# Patient Record
Sex: Male | Born: 1935 | Race: Black or African American | Hispanic: No | Marital: Married | Smoking: Former smoker
Health system: Southern US, Community
[De-identification: ages and names within clinical notes are randomized; demographics above are authoritative.]

## PROBLEM LIST (undated history)

## (undated) DIAGNOSIS — C61 Malignant neoplasm of prostate: Secondary | ICD-10-CM

## (undated) DIAGNOSIS — E785 Hyperlipidemia, unspecified: Secondary | ICD-10-CM

## (undated) DIAGNOSIS — Z86718 Personal history of other venous thrombosis and embolism: Secondary | ICD-10-CM

## (undated) DIAGNOSIS — K5792 Diverticulitis of intestine, part unspecified, without perforation or abscess without bleeding: Secondary | ICD-10-CM

## (undated) DIAGNOSIS — I10 Essential (primary) hypertension: Secondary | ICD-10-CM

## (undated) DIAGNOSIS — E119 Type 2 diabetes mellitus without complications: Secondary | ICD-10-CM

## (undated) DIAGNOSIS — M1990 Unspecified osteoarthritis, unspecified site: Secondary | ICD-10-CM

## (undated) HISTORY — DX: Diverticulitis of intestine, part unspecified, without perforation or abscess without bleeding: K57.92

## (undated) HISTORY — DX: Malignant neoplasm of prostate: C61

## (undated) HISTORY — DX: Personal history of other venous thrombosis and embolism: Z86.718

## (undated) HISTORY — PX: APPENDECTOMY: SHX54

## (undated) HISTORY — DX: Unspecified osteoarthritis, unspecified site: M19.90

## (undated) HISTORY — DX: Hyperlipidemia, unspecified: E78.5

## (undated) HISTORY — DX: Type 2 diabetes mellitus without complications: E11.9

## (undated) HISTORY — DX: Essential (primary) hypertension: I10

---

## 1998-08-17 ENCOUNTER — Ambulatory Visit (HOSPITAL_COMMUNITY): Admission: RE | Admit: 1998-08-17 | Discharge: 1998-08-17 | Payer: Self-pay | Admitting: Internal Medicine

## 1999-10-01 ENCOUNTER — Other Ambulatory Visit: Admission: RE | Admit: 1999-10-01 | Discharge: 1999-10-01 | Payer: Self-pay | Admitting: Urology

## 1999-12-08 ENCOUNTER — Encounter: Payer: Self-pay | Admitting: Internal Medicine

## 1999-12-08 ENCOUNTER — Encounter: Admission: RE | Admit: 1999-12-08 | Discharge: 1999-12-08 | Payer: Self-pay | Admitting: Internal Medicine

## 2000-04-24 ENCOUNTER — Encounter (INDEPENDENT_AMBULATORY_CARE_PROVIDER_SITE_OTHER): Payer: Self-pay | Admitting: Specialist

## 2000-04-24 ENCOUNTER — Other Ambulatory Visit: Admission: RE | Admit: 2000-04-24 | Discharge: 2000-04-24 | Payer: Self-pay | Admitting: Urology

## 2000-05-01 ENCOUNTER — Encounter: Payer: Self-pay | Admitting: Urology

## 2000-05-01 ENCOUNTER — Encounter: Admission: RE | Admit: 2000-05-01 | Discharge: 2000-05-01 | Payer: Self-pay | Admitting: Urology

## 2000-05-05 ENCOUNTER — Encounter: Admission: RE | Admit: 2000-05-05 | Discharge: 2000-08-03 | Payer: Self-pay | Admitting: Radiation Oncology

## 2000-11-14 ENCOUNTER — Encounter: Payer: Self-pay | Admitting: Emergency Medicine

## 2000-11-14 ENCOUNTER — Inpatient Hospital Stay (HOSPITAL_COMMUNITY): Admission: EM | Admit: 2000-11-14 | Discharge: 2000-11-23 | Payer: Self-pay | Admitting: Emergency Medicine

## 2000-11-16 ENCOUNTER — Encounter: Payer: Self-pay | Admitting: Internal Medicine

## 2000-11-20 ENCOUNTER — Encounter: Payer: Self-pay | Admitting: Internal Medicine

## 2000-12-01 ENCOUNTER — Encounter: Admission: RE | Admit: 2000-12-01 | Discharge: 2000-12-01 | Payer: Self-pay | Admitting: Internal Medicine

## 2000-12-01 ENCOUNTER — Encounter: Payer: Self-pay | Admitting: Internal Medicine

## 2004-07-05 ENCOUNTER — Ambulatory Visit (HOSPITAL_COMMUNITY): Admission: RE | Admit: 2004-07-05 | Discharge: 2004-07-05 | Payer: Self-pay | Admitting: Internal Medicine

## 2006-05-30 ENCOUNTER — Encounter: Admission: RE | Admit: 2006-05-30 | Discharge: 2006-05-30 | Payer: Self-pay | Admitting: Internal Medicine

## 2007-10-16 ENCOUNTER — Encounter: Admission: RE | Admit: 2007-10-16 | Discharge: 2007-10-16 | Payer: Self-pay | Admitting: Internal Medicine

## 2009-03-04 ENCOUNTER — Ambulatory Visit (HOSPITAL_COMMUNITY): Admission: RE | Admit: 2009-03-04 | Discharge: 2009-03-04 | Payer: Self-pay | Admitting: Cardiology

## 2009-03-10 ENCOUNTER — Inpatient Hospital Stay (HOSPITAL_BASED_OUTPATIENT_CLINIC_OR_DEPARTMENT_OTHER): Admission: RE | Admit: 2009-03-10 | Discharge: 2009-03-10 | Payer: Self-pay | Admitting: Cardiology

## 2010-06-24 ENCOUNTER — Encounter
Admission: RE | Admit: 2010-06-24 | Discharge: 2010-06-24 | Payer: Self-pay | Source: Home / Self Care | Attending: Internal Medicine | Admitting: Internal Medicine

## 2010-10-23 LAB — POCT I-STAT GLUCOSE
Glucose, Bld: 103 mg/dL — ABNORMAL HIGH (ref 70–99)
Operator id: 194801

## 2010-11-04 ENCOUNTER — Encounter: Payer: Self-pay | Admitting: Internal Medicine

## 2010-11-30 NOTE — Cardiovascular Report (Signed)
NAME:  JAEGER, TRUEHEART               ACCOUNT NO.:  [id number]   MEDICAL RECORD NO.:  [id number]          PATIENT TYPE:  OIB   LOCATION:  1999                         FACILITY:  [facility]   PHYSICIAN:  Mohan N. Sharyn Lull, M.D. DATE OF BIRTH:  03/01/1926   DATE OF PROCEDURE:  03/10/2009  DATE OF DISCHARGE:  03/10/2009                            CARDIAC CATHETERIZATION   PROCEDURE:  Left cardiac cath with selective left and right coronary  angiography, left ventriculography via right groin using Judkins  technique.   INDICATIONS FOR PROCEDURE:  Mr. Bither is an 77 year old black male with  past medical history significant for hypertension, non-insulin-dependent  diabetes mellitus, sick sinus syndrome with marked asymptomatic sinus  bradycardia, degenerative joint disease, hypercholesteremia, history of  pulmonary embolism and DVT in the past, complains of numbness in both  shoulders and arms associated with diaphoresis lasting 10-15 minutes.  Denies any chest pain, nausea, denies shortness of breath.  Denies  palpitation, lightheadedness or syncope.  Denies dizziness.  Denies PND,  orthopnea, leg swelling.  The patient underwent Persantine Myoview on  03/04/09 which showed mild reversible ischemia in the mid and apical  segment of the inferior wall with EF of 63%.   PAST MEDICAL HISTORY:  As above.   PAST SURGICAL HISTORY:  He had appendectomy in the past.   ALLERGIES:  No known drug allergies.   MEDICATIONS:  At home he is on enteric-coated aspirin 81 mg p.o. daily,  Procardia XL 30 mg p.o. daily.  The patient on metformin 500 mg 1 tablet  daily.   SOCIAL HISTORY:  Married, retired, worked for [employer] in the  past.  Smokes less than half pack per day for 15 plus years, quit 50+  years ago.  No history of alcohol abuse.   FAMILY HISTORY:  Father died of questionable MI at the age of 30.  He  had severe disabling arthritis.  Mother died of complications of  diabetes.  She was  hypertensive at the age of 70.  One sister died of  CA.   PHYSICAL EXAMINATION:  GENERAL:  He is alert, awake, oriented x3 in no  acute distress.  VITAL SIGNS:  Blood pressure was 130/60, pulse was with inferior 40-45,  marked sinus bradycardia.  HEENT:  Conjunctivae pink.  NECK:  Supple.  No JVD, no bruit.  LUNGS:  Clear to auscultation without rhonchi or rales.  CARDIOVASCULAR:  S1, S2 was normal.  There was soft systolic murmur.  ABDOMEN:  Soft.  Bowel sounds present, nontender.  EXTREMITIES:  There is no clubbing, cyanosis or edema.   IMPRESSION:  Left arm numbness, abnormal Persantine Myoview, probably  angina equivalent, rule out coronary insufficiency, sick sinus syndrome  with marked sinus bradycardia, hypertension, non-insulin-dependent  diabetes mellitus, hypercholesteremia, history of pulmonary embolism and  DVT.  Discussed with the patient and his wife regarding Persantine  Myoview result and left cath, its risks and benefits i.e. death, MI, and  stroke, need for emergency CABG, local vascular complications etc. and  consented for the cath.   PROCEDURE:  After obtaining the informed consent, the patient was  brought to the cath lab and was placed on fluoroscopy table.  Right  groin was prepped and draped in usual fashion.  2% Xylocaine was used  for local anesthesia in the right groin with help of thin-wall needle 4-  French arterial sheath was placed.  The sheath was aspirated and  flushed.  Next, 4-French left Judkins catheter was advanced over the  wire under fluoroscopic guidance up to the ascending aorta.  Wire was  pulled out, the catheter was aspirated and connected to the manifold.  Catheter was further advanced and engaged into the left coronary ostium.  Multiple views of the left system were taken.  Next catheter was  disengaged and was pulled out over the wire and was replaced with 4-  [place] 3-D right Judkins, catheter was advanced over the wire under   fluoroscopic guidance up to the ascending aorta.  Wire was pulled out  and the catheter was aspirated and connected to the manifold.  Catheter  was further advanced and engaged into right coronary ostium.  Multiple  views of the right system were taken.  Next catheter was disengaged and  was pulled out over the wire and was replaced with 4-French pigtail  catheter which was advanced over the wire under fluoroscopic guidance up  to the ascending aorta.  Wire was pulled out, the catheter was aspirated  and connected to the manifold.  Catheter was further advanced across the  aortic valve into the LV.  LV pressures were recorded.  Next LV graft  was done in 30 degrees RAO position.  Post angiographic pressures were  recorded from LV and then pullback pressures were recorded from the  aorta.  There was no gradient across the aortic valve.  Next a pigtail  catheter was pulled out over the wire.  Sheaths were aspirated and  flushed.  LV showed good LV systolic function, EF of 55-60%.  There was  mild LVH, left main was patent.  LAD has 20% ostial stenosis and 40-50%  mid stenosis with haziness with TIMI grade III distal flow.  Diagonal  one has 50-60% ostial stenosis.  Diagonal II has 70-75% proximal  sequential stenosis.  Vessel is small, less than 2 mm in size.  Left  circumflex has 15-20% ostial stenosis and 40-50% mid and 60-70% distal  focal stenosis.  OM1 is very very small.  OM-2 has 50-60% ostial  stenosis.  OM III and four were very small.  RCA has 15-20% distal  stenosis.  PDA is small which is patent.  PLV branches were very very  small.  The patient tolerated procedure well.  There were no  complications.  The patient was transferred to recovery room in stable  condition.      Eduardo Osier. Sharyn Lull, M.D.  Electronically Signed     [facility]/MEDQ  D:  03/10/2009  T:  03/11/2009  Job:  [id number]

## 2010-12-03 NOTE — H&P (Signed)
[place]  Patient:    Kyle Schwartz, Kyle Schwartz                      MRN: [id number] Adm. Date:  [id number] Attending:  Gwenyth Bender                         History and Physical  CHIEF COMPLAINT:  Pleuritic chest pain, pulmonary embolus.  HISTORY OF PRESENT ILLNESS:  First recent [facility] admission for this 77 year old married black male with past history of prostatic CA and hypertension, who had otherwise been doing well up until last night.  He states that he developed "a mild cold" yesterday evening.  This was associated with some sinus congestion and sneezing with cough.  He rested that night; however, during the night, approximately at 11 p.m., he noted the onset of pleuritic right-sided pain.  He rated the pain as a 10/10.  It was worse with turning as well.  He noted increased pain with cough.  There was no hematemesis or diaphoresis.  During the night, the pain persisted.  This morning, as there was no improvement, the family contacted the office and he was advised to come to the emergency room for further evaluation.  PAST MEDICAL HISTORY:  Past medical history is remarkable for prostatic CA. He is status post radiation treatment.  He has a long-standing history of hypertension, distant history of atrial fibrillation which has been stable, status post appendectomy.  ALLERGIES:  No known allergies.  MEDICATIONS: 1. Norvasc 5 mg p.o. q.d. 2. Vioxx 25 mg q.d. p.r.n. arthralgias.  HABITS:  Patient does not smoke.  Rare alcohol intake.  SOCIAL HISTORY:  Patient is retired from the [employer], therapeutic.  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  No recent trauma.  REVIEW OF SYSTEMS:  Remarkable for transient episode of bilateral lower extremity edema after a prolonged airline flight, this, however, resolved completely without leg pains.  No episodic edema.  No associated shortness of breath or chest pains during that  episode.  History also significant for hemorrhoids in the past.  He has also been documented to have small scattered diverticula on colonoscopy approximately two years ago.  PHYSICAL EXAMINATION:  GENERAL:  He is a well-developed, well-nourished black male in mild distress.  VITAL SIGNS:  Blood pressure of 134/70, pulse 64, respiratory rate 18.  Height 70 inches.  Weight 188 pounds.  HEENT:  Head normocephalic, atraumatic, without bruits.  Extraocular muscles are intact.  Fundi grade 1.  No sinus tenderness.  He has left turbinate edema greater than right.  TMs with decreased light reflex, without erythematous changes.  Throat:  Posterior pharynx is clear.  NECK:  Supple.  No enlarged thyroid.  No positive cervical nodes.  No carotid bruits.  LUNGS:  Notable for decreased breath sounds in the right base.  Left base is relatively clear.  No wheezes or rhonchi appreciated.  CARDIOVASCULAR:  He has a normal S1 and S2, no S3 or S4.  He has a 1/6 systolic ejection murmur in the apical area.  No rub appreciated.  ABDOMEN:  Bowel sounds are present.  He has mild right upper quadrant tenderness to deep palpation.  No epigastric tenderness.  No left flank tenderness.  There is minimal right lower quadrant tenderness to deep palpation.  No masses are appreciated.  GU:  Normal male with bilaterally descended testes.  MUSCULOSKELETAL:  Full  passive range of motion in the lower extremities with minimal crepitus in the knees.  Negative Homans.  Trace edema.  Pulses are intact.  NEUROLOGIC:  Intact.  LABORATORY DATA:  CBC revealed WBC of 7000, hemoglobin 11.4, hematocrit of 34.1, platelets 312,000.  Chemistries:  Sodium 137, potassium 4.0, chloride 109, CO2 33.7, BUN of 15, creatinine 1.1, glucose of 136.  CT scan of the chest shows pulmonary emboli in lower lobes bilaterally, right greater than left.  No evidence of emboli or thrombosis in the venous system.  EKG shows normal sinus  rhythm, normal axis without acute changes appreciated.  IMPRESSION: 1. New-onset pulmonary emboli bilaterally.  The patient is currently    symptomatic with pleuritic pain. 2. Status post radiation treatment for prostatic carcinoma. 3. Transient lower extremity edema, resolved, rule out occult deep venous    thrombosis. 4. History of hypertension (stable). 5. Allergic rhinitis with recent exacerbation. 6. History of degenerative joint disease, presently asymptomatic. 7. Distant history of diverticulosis without recent obvious exacerbation.  PLAN:  Patient to be admitted for further treatment of his pulmonary emboli. He will be placed on heparin protocol with Coumadin therapy as well, followed by pharmacy.  Will need to maintain a low therapeutic index in lieu of risk for bleeding from his GI tract.  Will follow up with venous Dopplers of the lower extremities as well.  Restart his Norvasc in low dose as necessary. Control pain acutely at this time.  Further therapy pending response to the above. DD:  11/14/00 TD:  11/15/00 Job: [id number]

## 2010-12-03 NOTE — Discharge Summary (Signed)
[facility]  Patient:    Kyle Schwartz, Kyle Schwartz                      MRN: [id number] Adm. Date:  [id number] Disc. Date: [id number] Attending:  Gwenyth Bender                           Discharge Summary  FINAL DIAGNOSES: 1. Pulmonary embolus and infarction (4 and 5.19). 2. Pulmonary collapse (69678). 3. Anemia (285.9). 4. Bronchitis (490). 5. Allergic rhinitis (477.9). 6. History of prostatic carcinoma (185).  PROCEDURE:  None.  HISTORY OF PRESENT ILLNESS:  This was the first recent [facility] admission for this 77 year old married black male with a past history of prostatic CA and hypertension who otherwise had been doing well until one night prior to admission.  The patient states that he developed a mild cold yesterday evening.  This was associated with some sinus congestion, sneezing, and cough.  During the night the patient noted the onset of right-sided pleuritic chest pain.  The pain was rated as a 10/10.  Patient noted increasing pain with cough as well.  There was no associated hematemesis or diaphoresis.  The pain persisted through most of the night.  The subsequent morning patient was advised to come to the emergency room for further evaluation and admission thereafter.  PAST MEDICAL HISTORY:  As noted above.  He is status post radiation treatment for prostatic CA.  Patient has a long-standing history of hypertension, distant history of atrial fibrillation which had most recently been stable, status post appendectomy.  PHYSICAL EXAMINATION:  Per admission H&P.  HOSPITAL COURSE:  Patient was admitted for new onset of pulmonary embolus. This was confirmed by a CT scan which showed pulmonary emboli in the lower lobes bilaterally, though right greater than left.  Notably, there was no evidence of thrombosis in the venous system.  Patient was admitted to telemetry.  He was started on IV heparin per protocol as well as Coumadin therapy.   The subsequent day venous Dopplers of his lower extremities were obtained which showed no evidence for deep venous disease as well.  Over the subsequent days patient did very well.  His heparin was maintained while his Coumadin dosing was adjusted to obtain a therapeutic range.  He was noted to have mild anemia during his hospital stay.  On further questioning he had noted he had problems with hemorrhoids several weeks ago which had subsequently subsided.  No occult blood was noted, however, during this hospital stay.  On followup examination he was noted to have rhonchi in both bases.  Chest x-ray showed linear atelectasis bilaterally.  There was some suggestion of bronchitis clinically and he was treated with antibiotics empirically as well.  Patient thereafter continued to improve.  He was gradually made more ambulatory which he tolerated well.  He underwent thorough education regarding the use of Coumadin and appropriate precautions.  After several days of adjustments with his Coumadin patient did become therapeutic. His heparin was subsequently discontinued.  He continued to progress thereafter without further sequela.  By Nov 23, 2000 he was doing better and felt to be stable for discharge home.  DISCHARGE MEDICATIONS: 1. Coumadin 5 mg p.o. q.d. 2. ______ 500 mg b.i.d. for seven days. 3. Norvasc 5 mg q.d. 4. Claritin 10 mg q.d. 5. Darvocet-N 100 one p.o. q.4h. p.r.n. pain.  Patient will need a followup INR in four  days time.  He has been advised to rest at home over the next weeks time with reevaluation thereafter.  He will be placed on a no added salt diet.  Patient will need a followup chest x-ray to document resolution of the bilateral opacities that were seen during this hospital stay. DD:  01/10/01 TD:  01/11/01 Job: [id number]

## 2011-08-22 ENCOUNTER — Other Ambulatory Visit: Payer: Self-pay | Admitting: Cardiology

## 2012-03-20 ENCOUNTER — Encounter: Payer: Self-pay | Admitting: Gastroenterology

## 2012-03-26 ENCOUNTER — Encounter: Payer: Self-pay | Admitting: Gastroenterology

## 2012-04-19 ENCOUNTER — Encounter: Payer: Self-pay | Admitting: Gastroenterology

## 2012-04-19 ENCOUNTER — Ambulatory Visit (INDEPENDENT_AMBULATORY_CARE_PROVIDER_SITE_OTHER): Payer: No Typology Code available for payment source | Admitting: Gastroenterology

## 2012-04-19 VITALS — BP 132/60 | HR 72 | Ht 67.0 in | Wt 187.8 lb

## 2012-04-19 DIAGNOSIS — K627 Radiation proctitis: Secondary | ICD-10-CM | POA: Insufficient documentation

## 2012-04-19 DIAGNOSIS — K6289 Other specified diseases of anus and rectum: Secondary | ICD-10-CM

## 2012-04-19 DIAGNOSIS — Z1211 Encounter for screening for malignant neoplasm of colon: Secondary | ICD-10-CM | POA: Insufficient documentation

## 2012-04-19 NOTE — Assessment & Plan Note (Signed)
Asymptomatic. 

## 2012-04-19 NOTE — Assessment & Plan Note (Signed)
In view of the patient's age routine screening colonoscopy is not generally recommended. I explained this to the patient who is comfortable with this decision.

## 2012-04-19 NOTE — Patient Instructions (Addendum)
Follow up as needed

## 2012-04-19 NOTE — Progress Notes (Signed)
History of Present Illness: Kyle Schwartz 78 year old Afro-American male here to inquire about screening colonoscopy. He has no GI complaints except for mild constipation which has been chronic. Last colonoscopy in 2003 demonstrated radiation changes to the rectum. He has a history of prostate cancer. He has no other GI complaints including change of bowel habits, abdominal pain, melena or hematochezia.    Past Medical History  Diagnosis Date  . Arthritis   . Prostate cancer   . Diverticulitis   . Hx of blood clots     2002  . Hypertension   . Hyperlipemia    Past Surgical History  Procedure Date  . Appendectomy     1963   family history includes Diabetes in an unspecified family member; Heart disease in his father; and Lung cancer in his sister. Current Outpatient Prescriptions  Medication Sig Dispense Refill  . aspirin 81 MG tablet Take 81 mg by mouth daily.      . benazepril (LOTENSIN) 20 MG tablet Take 20 mg by mouth daily.       . Cholecalciferol (VITAMIN D3) 2000 UNITS TABS Take by mouth 1 day or 1 dose.      Marland Kitchen CRESTOR 10 MG tablet Take 10 mg by mouth daily.       Marland Kitchen doxazosin (CARDURA) 4 MG tablet Take 4 mg by mouth at bedtime.       . fish oil-omega-3 fatty acids 1000 MG capsule Take 2 g by mouth daily.      . metFORMIN (GLUCOPHAGE) 500 MG tablet Take 500 mg by mouth daily with breakfast.       . Multiple Vitamins-Minerals (CENTRUM SILVER PO) Take by mouth 1 day or 1 dose.       Allergies as of 04/19/2012  . (Not on File)    reports that he has quit smoking. He has never used smokeless tobacco. He reports that he does not drink alcohol or use illicit drugs.     Review of Systems: Pertinent positive and negative review of systems were noted in the above HPI section. All other review of systems were otherwise negative.  Vital signs were reviewed in today's medical record Physical Exam: General: Well developed , well nourished, no acute distress Head: Normocephalic and  atraumatic Eyes:  sclerae anicteric, EOMI Ears: Normal auditory acuity Mouth: No deformity or lesions Neck: Supple, no masses or thyromegaly Lungs: Clear throughout to auscultation Heart: Regular rate and rhythm; no murmurs, rubs or bruits Abdomen: Soft, non tender and non distended. No masses, hepatosplenomegaly or hernias noted. Normal Bowel sounds Rectal:deferred Musculoskeletal: Symmetrical with no gross deformities  Skin: No lesions on visible extremities Pulses:  Normal pulses noted Extremities: No clubbing, cyanosis, edema or deformities noted Neurological: Alert oriented x 4, grossly nonfocal Cervical Nodes:  No significant cervical adenopathy Inguinal Nodes: No significant inguinal adenopathy Psychological:  Alert and cooperative. Normal mood and affect

## 2012-05-31 LAB — BASIC METABOLIC PANEL
Creatinine: 1.2 mg/dL (ref 0.6–1.3)
Potassium: 4.5 mmol/L (ref 3.4–5.3)
Sodium: 142 mmol/L (ref 137–147)

## 2012-05-31 LAB — HEPATIC FUNCTION PANEL
ALT: 13 U/L (ref 10–40)
Alkaline Phosphatase: 53 U/L (ref 25–125)

## 2012-05-31 LAB — CBC AND DIFFERENTIAL: HCT: 41 % (ref 41–53)

## 2012-09-06 ENCOUNTER — Encounter: Payer: Self-pay | Admitting: General Practice

## 2012-09-06 DIAGNOSIS — J309 Allergic rhinitis, unspecified: Secondary | ICD-10-CM

## 2012-09-06 DIAGNOSIS — E559 Vitamin D deficiency, unspecified: Secondary | ICD-10-CM | POA: Insufficient documentation

## 2014-09-03 ENCOUNTER — Ambulatory Visit
Admission: RE | Admit: 2014-09-03 | Discharge: 2014-09-03 | Disposition: A | Discharge status: Home / Self Care | Payer: Medicare Other | Source: Ambulatory Visit | Attending: Internal Medicine | Admitting: Internal Medicine

## 2014-09-03 ENCOUNTER — Other Ambulatory Visit: Payer: Self-pay | Admitting: Internal Medicine

## 2014-09-03 DIAGNOSIS — R0602 Shortness of breath: Secondary | ICD-10-CM

## 2014-09-03 DIAGNOSIS — R0989 Other specified symptoms and signs involving the circulatory and respiratory systems: Secondary | ICD-10-CM

## 2014-09-03 DIAGNOSIS — M25562 Pain in left knee: Secondary | ICD-10-CM

## 2014-11-28 ENCOUNTER — Ambulatory Visit
Admission: RE | Admit: 2014-11-28 | Discharge: 2014-11-28 | Disposition: A | Discharge status: Home / Self Care | Payer: Medicare Other | Source: Ambulatory Visit | Attending: Internal Medicine | Admitting: Internal Medicine

## 2014-11-28 ENCOUNTER — Other Ambulatory Visit: Payer: Self-pay | Admitting: Internal Medicine

## 2014-11-28 DIAGNOSIS — R9389 Abnormal findings on diagnostic imaging of other specified body structures: Secondary | ICD-10-CM

## 2014-12-03 ENCOUNTER — Other Ambulatory Visit: Payer: Self-pay | Admitting: Cardiology

## 2014-12-03 DIAGNOSIS — R079 Chest pain, unspecified: Secondary | ICD-10-CM

## 2014-12-08 ENCOUNTER — Encounter (HOSPITAL_COMMUNITY)
Admission: RE | Admit: 2014-12-08 | Discharge: 2014-12-08 | Disposition: A | Discharge status: Home / Self Care | Payer: Medicare Other | Source: Ambulatory Visit | Attending: Cardiology | Admitting: Cardiology

## 2014-12-08 ENCOUNTER — Ambulatory Visit (HOSPITAL_COMMUNITY)
Admission: RE | Admit: 2014-12-08 | Discharge: 2014-12-08 | Disposition: A | Discharge status: Home / Self Care | Payer: Medicare Other | Source: Ambulatory Visit | Attending: Cardiology | Admitting: Cardiology

## 2014-12-08 DIAGNOSIS — R079 Chest pain, unspecified: Secondary | ICD-10-CM | POA: Insufficient documentation

## 2014-12-08 MED ORDER — REGADENOSON 0.4 MG/5ML IV SOLN
INTRAVENOUS | Status: AC
  Administered 2014-12-08: 0.4 mg
  Filled 2014-12-08: qty 5

## 2014-12-08 MED ORDER — TECHNETIUM TC 99M SESTAMIBI GENERIC - CARDIOLITE
30.0000 | Freq: Once | INTRAVENOUS | Status: AC | PRN
  Administered 2014-12-08: 30 via INTRAVENOUS

## 2014-12-08 MED ORDER — TECHNETIUM TC 99M SESTAMIBI GENERIC - CARDIOLITE
10.0000 | Freq: Once | INTRAVENOUS | Status: AC | PRN
  Administered 2014-12-08: 10 via INTRAVENOUS

## 2015-07-14 LAB — NM MYOCAR MULTI W/SPECT W/WALL MOTION / EF
CHL CUP NUCLEAR SSS: 7
CHL CUP STRESS STAGE 1 DBP: 59 mmHg
CHL CUP STRESS STAGE 1 GRADE: 0 %
CHL CUP STRESS STAGE 1 HR: 46 {beats}/min
CHL CUP STRESS STAGE 1 SPEED: 0 mph
CHL CUP STRESS STAGE 2 GRADE: 0 %
CHL CUP STRESS STAGE 2 HR: 46 {beats}/min
CHL CUP STRESS STAGE 2 SPEED: 0 mph
CHL CUP STRESS STAGE 3 GRADE: 0 %
CHL CUP STRESS STAGE 4 DBP: 49 mmHg
CHL CUP STRESS STAGE 4 HR: 57 {beats}/min
CHL CUP STRESS STAGE 5 GRADE: 0 %
CHL CUP STRESS STAGE 5 SBP: 150 mmHg
CHL CUP STRESS STAGE 5 SPEED: 0 mph
CSEPPBP: 150 mmHg
CSEPPHR: 61 {beats}/min
Estimated workload: 1 METS
LV dias vol: 112 mL
LV sys vol: 37 mL
NUC STRESS TID: 0.81
Percent of predicted max HR: 45 %
RATE: 0.31
SDS: 0
SRS: 1
Stage 1 SBP: 140 mmHg
Stage 3 DBP: 54 mmHg
Stage 3 HR: 60 {beats}/min
Stage 3 SBP: 145 mmHg
Stage 3 Speed: 0 mph
Stage 4 Grade: 0 %
Stage 4 SBP: 152 mmHg
Stage 4 Speed: 0 mph
Stage 5 DBP: 50 mmHg
Stage 5 HR: 61 {beats}/min

## 2016-06-12 IMAGING — CR DG KNEE 1-2V*L*
2 series · 2 of 2 positions shown · non-contrast
Comparison: 10/16/2007

CLINICAL DATA: Left knee pain

EXAM:
LEFT KNEE - 1-2 VIEW

[t knee ap left]
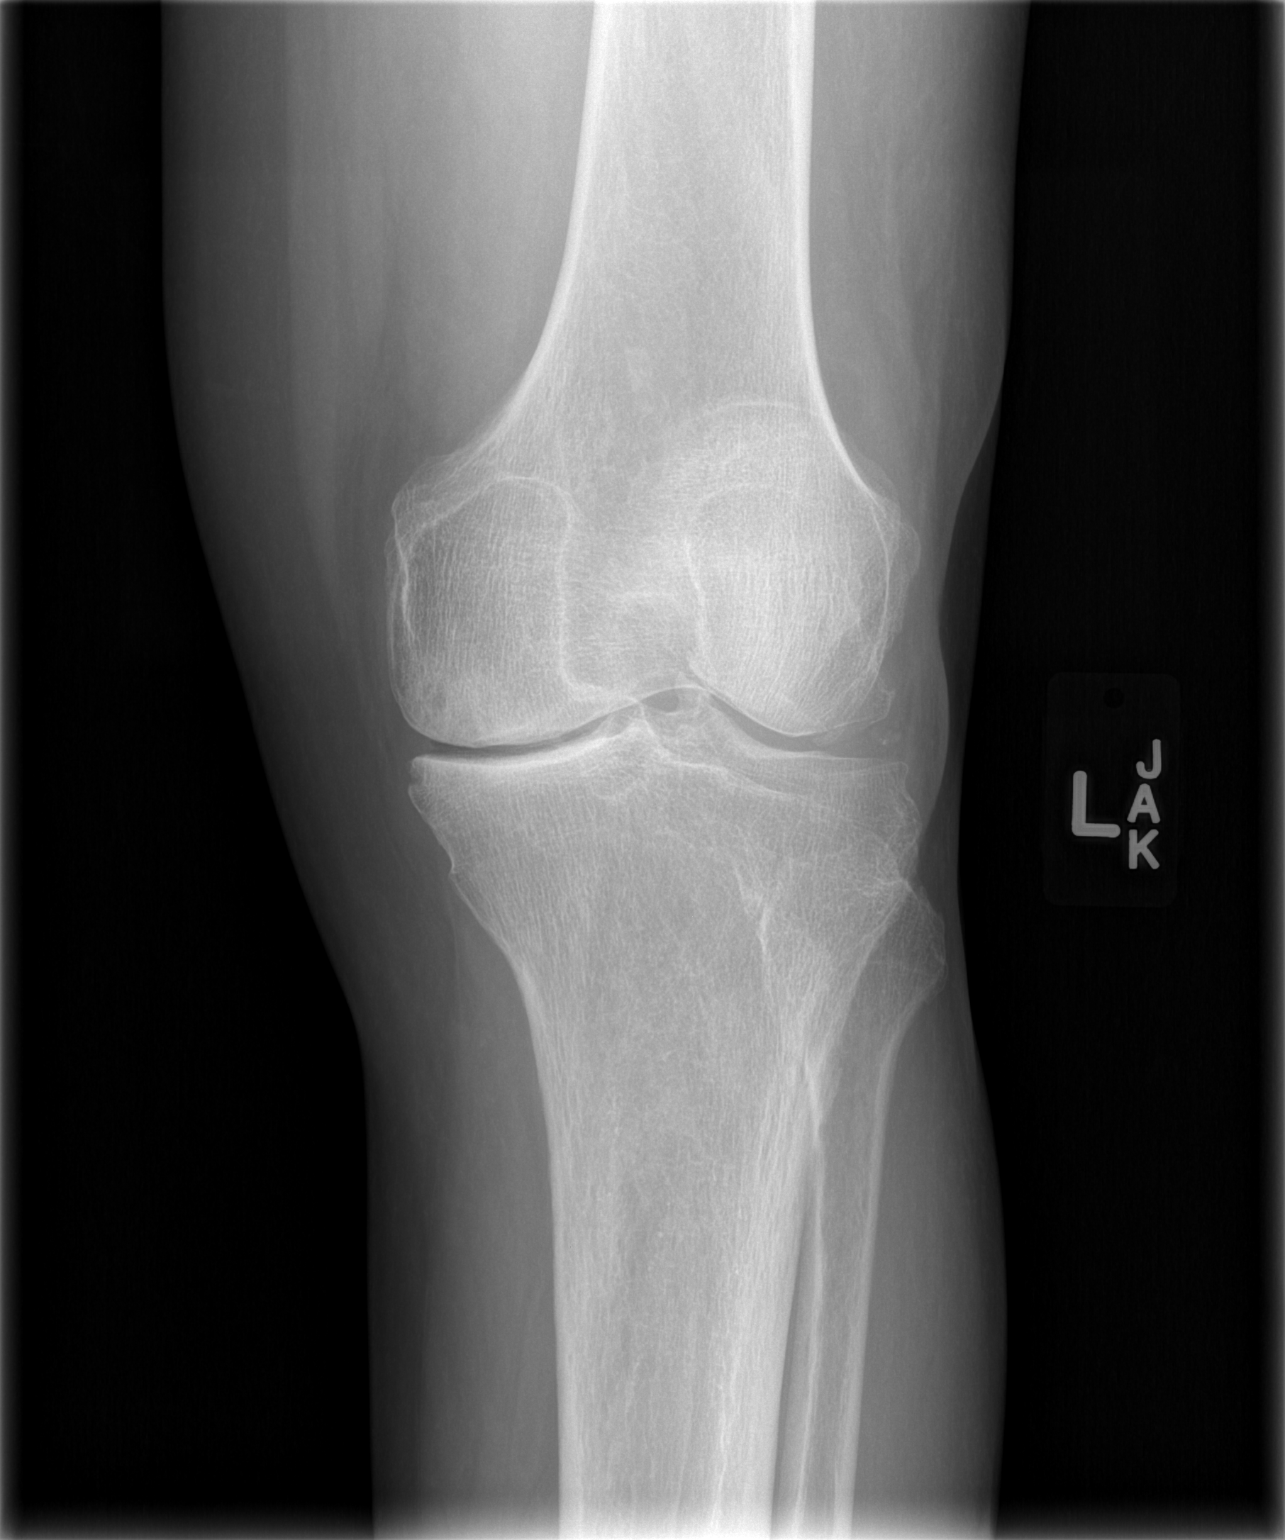

[t knee lat left]
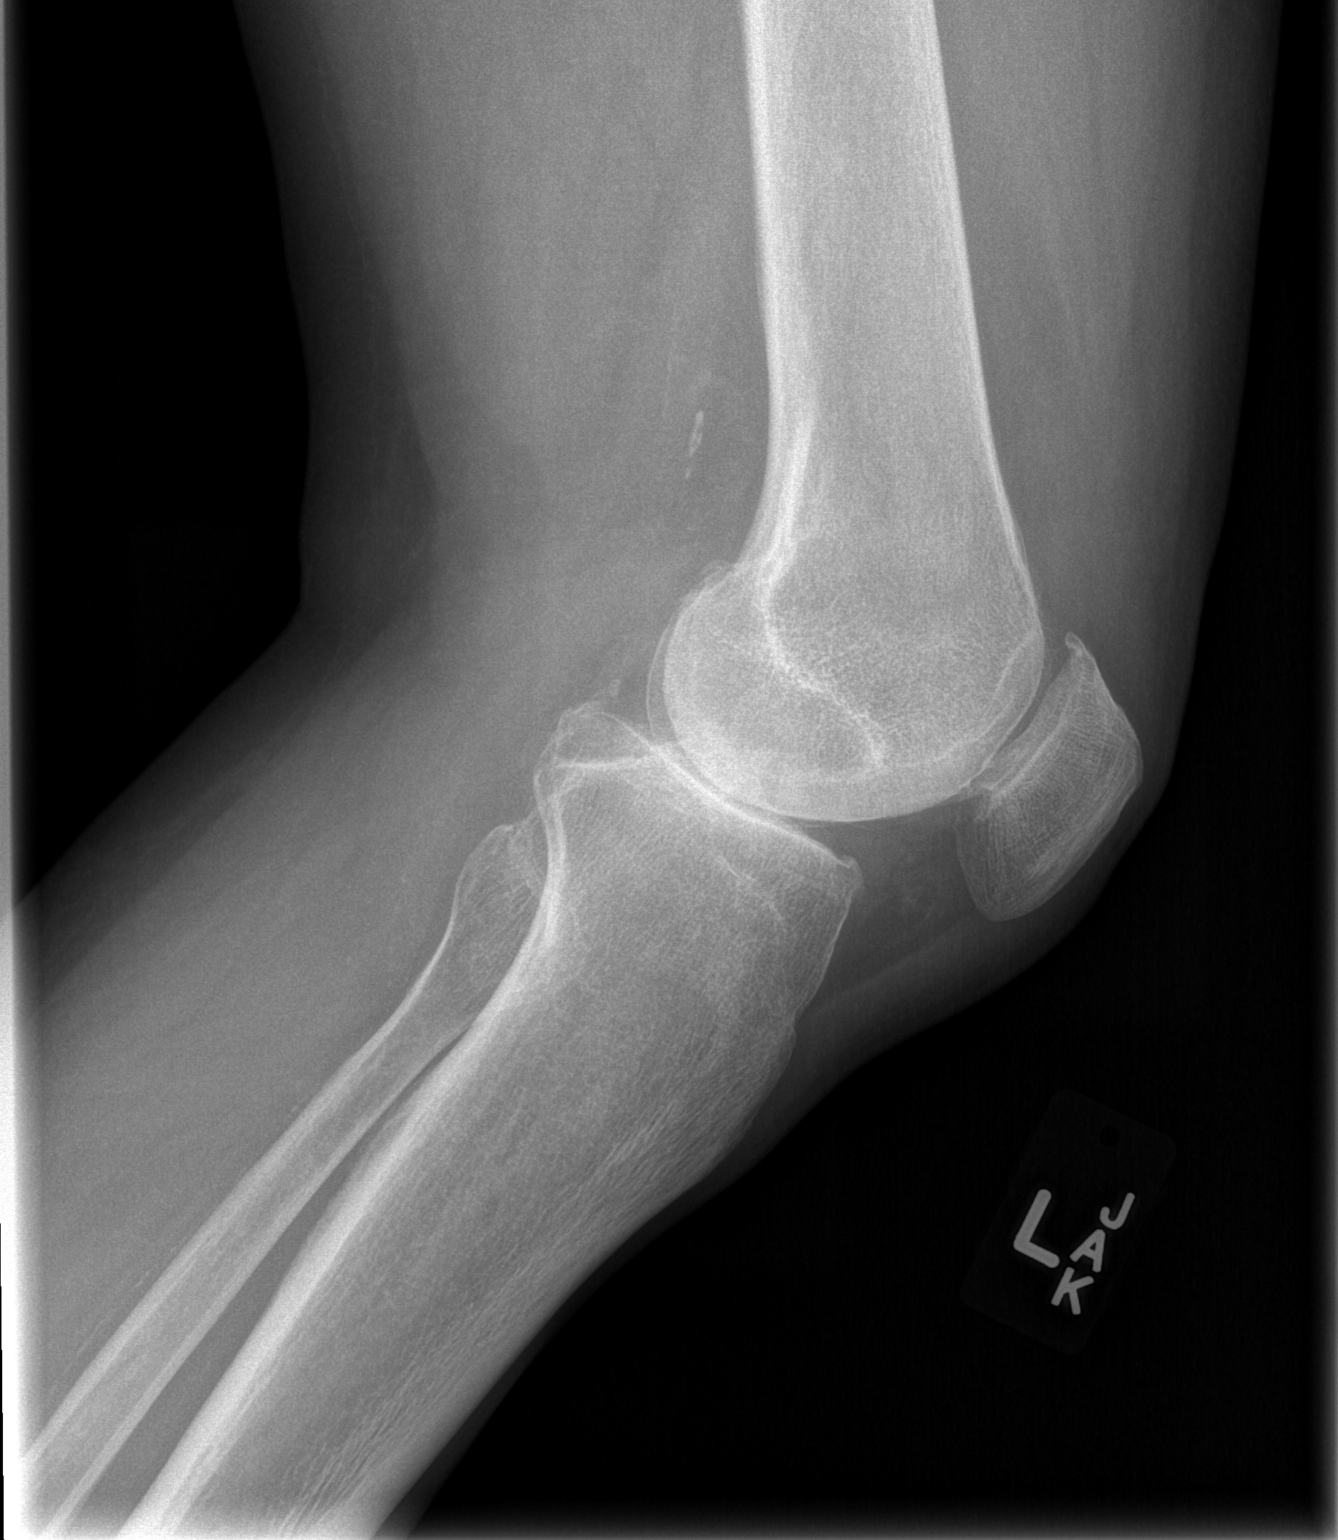

[2 of 2 positions shown; findings below may reference images not displayed]

FINDINGS: Two views of the left knee submitted. There is moderate narrowing of
medial joint compartment. Mild narrowing of lateral joint
compartment. Mild chondrocalcinosis. Narrowing of patellofemoral
joint space. No joint effusion. No acute fracture or subluxation.
IMPRESSION: No acute fracture or subluxation. Osteoarthritic changes as
described above. Mild chondrocalcinosis.

## 2016-09-06 IMAGING — CR DG CHEST SPECIAL VIEW
1 series · 1 of 1 positions shown · non-contrast
Comparison: 09/03/2014 .

CLINICAL DATA: Possible nipple shadow .

EXAM:
CHEST SPECIAL VIEW

[view not recorded]
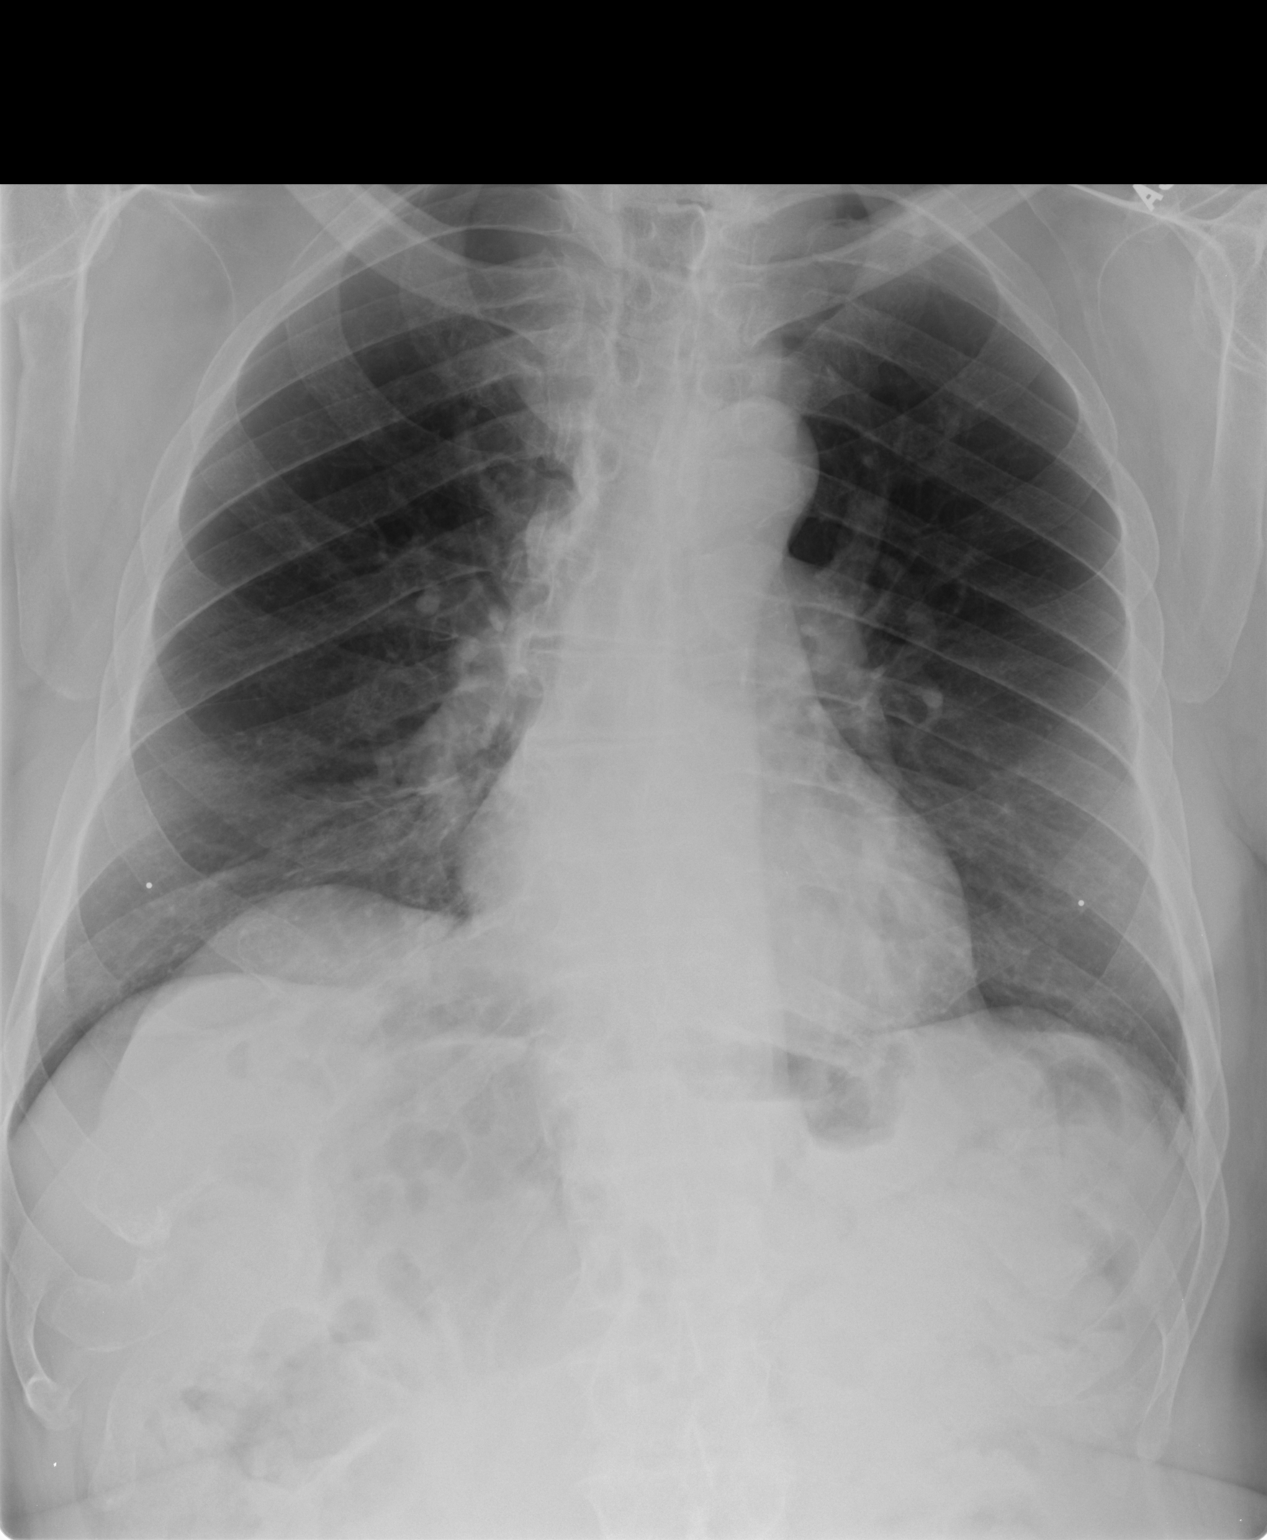

[1 of 1 positions shown; findings below may reference images not displayed]

FINDINGS: Chest x-ray obtained with nipple markers. Mediastinum hilar
structures normal. Heart size stable. Previously identified
questionable nodular densities represent nipple shadows. No pleural
effusion or pneumothorax
IMPRESSION: Previously identified nodular densities represent nipple shadows.

## 2016-09-13 ENCOUNTER — Ambulatory Visit (INDEPENDENT_AMBULATORY_CARE_PROVIDER_SITE_OTHER): Payer: Medicare Other | Admitting: Physician Assistant

## 2016-09-13 VITALS — BP 124/60 | HR 66 | Temp 98.2°F | Resp 18 | Ht 68.0 in | Wt 186.6 lb

## 2016-09-13 DIAGNOSIS — J209 Acute bronchitis, unspecified: Secondary | ICD-10-CM | POA: Diagnosis not present

## 2016-09-13 DIAGNOSIS — R05 Cough: Secondary | ICD-10-CM | POA: Diagnosis not present

## 2016-09-13 DIAGNOSIS — R0982 Postnasal drip: Secondary | ICD-10-CM | POA: Diagnosis not present

## 2016-09-13 DIAGNOSIS — R059 Cough, unspecified: Secondary | ICD-10-CM

## 2016-09-13 MED ORDER — AZITHROMYCIN 250 MG PO TABS: ORAL_TABLET | ORAL | 0 refills | Status: DC

## 2016-09-13 MED ORDER — MUCINEX DM MAXIMUM STRENGTH 60-1200 MG PO TB12: 1.0000 | ORAL_TABLET | Freq: Two times a day (BID) | ORAL | 1 refills | Status: DC

## 2016-09-13 MED ORDER — PROMETHAZINE-DM 6.25-15 MG/5ML PO SYRP: 5.0000 mL | ORAL_SOLUTION | Freq: Four times a day (QID) | ORAL | 0 refills | Status: DC | PRN

## 2016-09-13 MED ORDER — FLUTICASONE PROPIONATE 50 MCG/ACT NA SUSP: 2.0000 | Freq: Every day | NASAL | 6 refills | Status: AC

## 2016-09-13 MED ORDER — BENZONATATE 100 MG PO CAPS: 100.0000 mg | ORAL_CAPSULE | Freq: Three times a day (TID) | ORAL | 0 refills | Status: DC | PRN

## 2016-09-13 NOTE — Progress Notes (Signed)
     IF you received an x-ray today, you will receive an invoice from [facility]. Please contact [facility] at [phone] with questions or concerns regarding your invoice.   IF you received labwork today, you will receive an invoice from [facility]. Please contact [facility] at [phone] with questions or concerns regarding your invoice.   Our billing staff will not be able to assist you with questions regarding bills from these companies.  You will be contacted with the lab results as soon as they are available. The fastest way to get your results is to activate your My Chart account. Instructions are located on the last page of this paperwork. If you have not heard from us regarding the results in 2 weeks, please contact this office.     

## 2016-09-13 NOTE — Patient Instructions (Signed)
Flonase - 2 sprays each nostril at night before bed. You may repeat this during the day if needed for nasal congestion.  Neti Pot nasal rinses may help clear your mucus.  Tessalon - for daytime cough.  Mucinex - please stay well hydrated and drink plenty of fluids while taking this.  Please come back in 5-7 days if you are not better.   Thank you for coming in today. I hope you feel we met your needs.  Feel free to call [facility] if you have any questions or further requests.  Please consider signing up for MyChart if you do not already have it, as this is a great way to communicate with me.  Best,  [employer], PA-C

## 2016-09-13 NOTE — Progress Notes (Signed)
Kyle Schwartz  MRN: [id number] DOB: [birth year]  PCP: Rogers Blocker, MD  Subjective:  Pt is an 83 year old male PMH HTN, diabetes who presents to clinic for cough x 1 week. Cough is to the point of gagging due to mucus production. Wakes him up at night. Endorses hoarseness. Some runny nose. Cough and congestion is worse at night. Cough is present during the day but not as bad.  Denies fever, chills, nausea, vomiting, abdominal pain, sinus pressure/pain. He has tried Robitussin, allergy pills. Not helping much.   Review of Systems  Constitutional: Negative for chills, diaphoresis, fatigue and fever.  HENT: Positive for postnasal drip and voice change. Negative for congestion, rhinorrhea, sinus pain, sinus pressure and sore throat.   Respiratory: Positive for cough. Negative for chest tightness, shortness of breath and wheezing.   Cardiovascular: Negative for chest pain and palpitations.  Gastrointestinal: Negative for abdominal pain, diarrhea, nausea and vomiting.  Musculoskeletal: Negative for neck pain.  Neurological: Negative for dizziness, syncope, light-headedness and headaches.  Psychiatric/Behavioral: Positive for sleep disturbance.    Patient Active Problem List   Diagnosis Date Noted  . Unspecified vitamin D deficiency 09/06/2012  . Allergic rhinitis 09/06/2012  . Special screening for malignant neoplasms, colon 04/19/2012  . Radiation proctitis 04/19/2012    Current Outpatient Prescriptions on File Prior to Visit  Medication Sig Dispense Refill  . amLODipine (NORVASC) 10 MG tablet Take 10 mg by mouth daily.    Marland Kitchen aspirin 81 MG tablet Take 81 mg by mouth daily.    . benazepril (LOTENSIN) 20 MG tablet Take 20 mg by mouth daily.     . Cholecalciferol (VITAMIN D3) 2000 UNITS TABS Take by mouth 1 day or 1 dose.    Marland Kitchen CRESTOR 10 MG tablet Take 10 mg by mouth daily.     Marland Kitchen doxazosin (CARDURA) 4 MG tablet Take 4 mg by mouth at bedtime.     . fish oil-omega-3 fatty acids 1000 MG  capsule Take 2 g by mouth daily.    . metFORMIN (GLUCOPHAGE) 500 MG tablet Take 500 mg by mouth daily with breakfast.     . Multiple Vitamins-Minerals (CENTRUM SILVER PO) Take by mouth 1 day or 1 dose.     No current facility-administered medications on file prior to visit.     Not on File   Objective:  BP 124/60 (Cuff Size: Normal)   Pulse 66   Temp 98.2 F (36.8 C) (Oral)   Resp 18   Ht 5\' 8"  (1.727 m)   Wt 186 lb 9.6 oz (84.6 kg)   SpO2 97%   BMI 28.37 kg/m   Physical Exam  Constitutional: He is oriented to person, place, and time and well-developed, well-nourished, and in no distress. No distress.  HENT:  Nose: Mucosal edema present. No rhinorrhea. Right sinus exhibits no maxillary sinus tenderness and no frontal sinus tenderness. Left sinus exhibits no maxillary sinus tenderness and no frontal sinus tenderness.  Mouth/Throat: Mucous membranes are normal. Posterior oropharyngeal edema present. No oropharyngeal exudate or posterior oropharyngeal erythema.  Cardiovascular: Normal rate, regular rhythm, S1 normal, S2 normal and normal heart sounds.   Pulmonary/Chest: Effort normal. He has rhonchi in the right lower field.  Lymphadenopathy:    He has no cervical adenopathy.  Neurological: He is alert and oriented to person, place, and time. GCS score is 15.  Skin: Skin is warm and dry.  Psychiatric: Mood, memory, affect and judgment normal.  Vitals reviewed.   Assessment  and Plan :  1. Acute bronchitis, unspecified organism 2. Cough 3. Post-nasal drip - azithromycin (ZITHROMAX) 250 MG tablet; Take 2 tabs PO x 1 dose, then 1 tab PO QD x 4 days  Dispense: 6 tablet; Refill: 0 - promethazine-dextromethorphan (PROMETHAZINE-DM) 6.25-15 MG/5ML syrup; Take 5 mLs by mouth 4 (four) times daily as needed for cough.  Dispense: 118 mL; Refill: 0 - Dextromethorphan-Guaifenesin (MUCINEX DM MAXIMUM STRENGTH) 60-1200 MG TB12; Take 1 tablet by mouth every 12 (twelve) hours.  Dispense: 14 each;  Refill: 1 - benzonatate (TESSALON) 100 MG capsule; Take 1-2 capsules (100-200 mg total) by mouth 3 (three) times daily as needed for cough.  Dispense: 40 capsule; Refill: 0 - fluticasone (FLONASE) 50 MCG/ACT nasal spray; Place 2 sprays into both nostrils daily.  Dispense: 16 g; Refill: 6 - Worsening cough x 1 week. Will treat empirically due to pt age, rhonchi heard RLL and PMH HTN and diabetes. Push fluids and rest.  RTC precautions discussed.  Mercer Pod, PA-C  [facility] 09/13/2016 2:24 PM

## 2017-02-15 ENCOUNTER — Ambulatory Visit
Admission: RE | Admit: 2017-02-15 | Discharge: 2017-02-15 | Disposition: A | Discharge status: Home / Self Care | Payer: Medicare Other | Source: Ambulatory Visit | Attending: Internal Medicine | Admitting: Internal Medicine

## 2017-02-15 ENCOUNTER — Other Ambulatory Visit: Payer: Self-pay | Admitting: Internal Medicine

## 2017-02-15 DIAGNOSIS — R918 Other nonspecific abnormal finding of lung field: Secondary | ICD-10-CM

## 2017-10-07 ENCOUNTER — Other Ambulatory Visit: Payer: Self-pay

## 2017-10-07 ENCOUNTER — Ambulatory Visit (HOSPITAL_COMMUNITY)
Admission: EM | Admit: 2017-10-07 | Discharge: 2017-10-07 | Disposition: A | Discharge status: Home / Self Care | Payer: Medicare Other | Attending: Emergency Medicine | Admitting: Emergency Medicine

## 2017-10-07 ENCOUNTER — Encounter (HOSPITAL_COMMUNITY): Payer: Self-pay | Admitting: *Deleted

## 2017-10-07 DIAGNOSIS — K1370 Unspecified lesions of oral mucosa: Secondary | ICD-10-CM

## 2017-10-07 MED ORDER — CHLORHEXIDINE GLUCONATE 0.12 % MT SOLN: 15.0000 mL | Freq: Two times a day (BID) | OROMUCOSAL | 0 refills | Status: DC

## 2017-10-07 NOTE — ED Triage Notes (Signed)
Pt c/o of bump on the "roof" of his mouth on the right side, per pt it started yesterday. Throbbing

## 2017-10-07 NOTE — ED Provider Notes (Signed)
[facility]    CSN: [id number] Arrival date & time: 10/07/17  1159     History   Chief Complaint Chief Complaint  Patient presents with  . Mass    HPI Kyle Schwartz is a 84 y.o. male.   Kyle Schwartz presents with complaints of swollen bump to roof of his mouth which developed yesterday am and progressed in size, increased pain today. Without drainage. No known injury, states ate popcorn two nights ago. Throbbing pain, was worse at night. Wears an upper partial but states didn't wear it today due to pain. Rates pain 8/10. Denies any previous similar. Without URI symptoms. No fevers.    ROS per HPI.      Past Medical History:  Diagnosis Date  . Arthritis   . Diabetes mellitus ([place])   . Diverticulitis   . Hx of blood clots    2002  . Hyperlipemia   . Hyperlipidemia   . Hypertension   . Prostate cancer [facility])     Patient Active Problem List   Diagnosis Date Noted  . Unspecified vitamin D deficiency 09/06/2012  . Allergic rhinitis 09/06/2012  . Special screening for malignant neoplasms, colon 04/19/2012  . Radiation proctitis 04/19/2012    Past Surgical History:  Procedure Laterality Date  . APPENDECTOMY     1963       Home Medications    Prior to Admission medications   Medication Sig Start Date End Date Taking? Authorizing Provider  amLODipine (NORVASC) 10 MG tablet Take 10 mg by mouth daily.    [provider]  aspirin 81 MG tablet Take 81 mg by mouth daily.    [provider]  benazepril (LOTENSIN) 20 MG tablet Take 20 mg by mouth daily.  04/01/12   [provider]  chlorhexidine (PERIDEX) 0.12 % solution Use as directed 15 mLs in the mouth or throat 2 (two) times daily. Swish and spit twice a day 10/07/17   Augusto Gamble B, NP  Cholecalciferol (VITAMIN D3) 2000 UNITS TABS Take by mouth 1 day or 1 dose.    [provider]  CRESTOR 10 MG tablet Take 10 mg by mouth daily.  02/27/12   [provider]    doxazosin (CARDURA) 4 MG tablet Take 4 mg by mouth at bedtime.  03/19/12   [provider]  fish oil-omega-3 fatty acids 1000 MG capsule Take 2 g by mouth daily.    [provider]  fluticasone (FLONASE) 50 MCG/ACT nasal spray Place 2 sprays into both nostrils daily. 09/13/16   McVey, Gelene Mink, PA-C  metFORMIN (GLUCOPHAGE) 500 MG tablet Take 500 mg by mouth daily with breakfast.  04/10/12   [provider]  Multiple Vitamins-Minerals (CENTRUM SILVER PO) Take by mouth 1 day or 1 dose.    [provider]    Family History Family History  Problem Relation Age of Onset  . Lung cancer Sister   . Diabetes Unknown   . Heart disease Father     Social History Social History   Tobacco Use  . Smoking status: Former Research scientist (life sciences)  . Smokeless tobacco: Never Used  Substance Use Topics  . Alcohol use: No  . Drug use: No     Allergies   Patient has no known allergies.   Review of Systems Review of Systems   Physical Exam Triage Vital Signs ED Triage Vitals [10/07/17 1211]  Enc Vitals Group     BP (!) 144/74     Pulse Rate 65  Resp      Temp 97.9 F (36.6 C)     Temp Source Oral     SpO2 97 %     Weight      Height      Head Circumference      Peak Flow      Pain Score      Pain Loc      Pain Edu?      Excl. in [place]?    No data found.  Updated Vital Signs BP (!) 144/74 (BP Location: Right Arm)   Pulse 65   Temp 97.9 F (36.6 C) (Oral)   SpO2 97%   Visual Acuity Right Eye Distance:   Left Eye Distance:   Bilateral Distance:    Right Eye Near:   Left Eye Near:    Bilateral Near:     Physical Exam  Constitutional: He is oriented to person, place, and time. He appears well-developed and well-nourished.  HENT:  Mouth/Throat:    Raised red area just inferior to front teeth, possible head noted; without surrounding palate swelling, open lesion, throat swelling, drainage or other rash  Cardiovascular: Normal rate and regular  rhythm.  Pulmonary/Chest: Effort normal and breath sounds normal.  Neurological: He is alert and oriented to person, place, and time.  Skin: Skin is warm and dry.     UC Treatments / Results  Labs (all labs ordered are listed, but only abnormal results are displayed) Labs Reviewed - No data to display  EKG None Radiology No results found.  Procedures Procedures (including critical care time)  Medications Ordered in UC Medications - No data to display   Initial Impression / Assessment and Plan / UC Course  I have reviewed the triage vital signs and the nursing notes.  Pertinent labs & imaging results that were available during my care of the patient were reviewed by me and considered in my medical decision making (see chart for details).     perdiex twice a day, warm fluids. Follow up with dentist for recheck. Hold off on using partial until resolution as this may be causing irritation to the area. Patient verbalized understanding and agreeable to plan.    Final Clinical Impressions(s) / UC Diagnoses   Final diagnoses:  Oral lesion    ED Discharge Orders        Ordered    chlorhexidine (PERIDEX) 0.12 % solution  2 times daily     10/07/17 1226       Controlled Substance Prescriptions [place] Controlled Substance Registry consulted? Not Applicable   Zigmund Gottron, NP 10/07/17 1233

## 2017-10-07 NOTE — Discharge Instructions (Addendum)
Please use mouth rinse twice a day, swish and spit. Warm fluids. Avoid use of your partial until resolution of symptoms. Please follow up with your dentist early next week for recheck.

## 2017-12-22 ENCOUNTER — Other Ambulatory Visit: Payer: Self-pay

## 2017-12-22 ENCOUNTER — Encounter: Payer: Self-pay | Admitting: Physician Assistant

## 2017-12-22 ENCOUNTER — Ambulatory Visit (INDEPENDENT_AMBULATORY_CARE_PROVIDER_SITE_OTHER): Payer: Medicare Other | Admitting: Physician Assistant

## 2017-12-22 VITALS — BP 130/60 | HR 58 | Temp 98.0°F | Resp 16 | Ht 67.5 in | Wt 184.2 lb

## 2017-12-22 DIAGNOSIS — J209 Acute bronchitis, unspecified: Secondary | ICD-10-CM | POA: Diagnosis not present

## 2017-12-22 DIAGNOSIS — R05 Cough: Secondary | ICD-10-CM

## 2017-12-22 DIAGNOSIS — R059 Cough, unspecified: Secondary | ICD-10-CM

## 2017-12-22 MED ORDER — BENZONATATE 100 MG PO CAPS: 100.0000 mg | ORAL_CAPSULE | Freq: Three times a day (TID) | ORAL | 0 refills | Status: DC | PRN

## 2017-12-22 MED ORDER — PROMETHAZINE-DM 6.25-15 MG/5ML PO SYRP: 5.0000 mL | ORAL_SOLUTION | Freq: Four times a day (QID) | ORAL | 0 refills | Status: AC | PRN

## 2017-12-22 MED ORDER — AZITHROMYCIN 250 MG PO TABS: ORAL_TABLET | ORAL | 0 refills | Status: AC

## 2017-12-22 NOTE — Patient Instructions (Addendum)
Start taking Azithromycin for the next 5 days.  Tessalon and promethazine is for cough   You can continue taking Mucinex as needed.  Stay well hydrated. Get lost of rest. Wash your hands often.  Come back if you are not improving in 5-7 days.   -Foods that can help speed recovery: honey, garlic, chicken soup, elderberries, green tea.  -Supplements that can help speed recovery: vitamin C, zinc, elderberry extract, quercetin, ginseng, selenium -Supplement with prebiotics and probiotics:    For sore throat try using a honey-based tea. Use 3 teaspoons of honey with juice squeezed from half lemon. Place shaved pieces of ginger into 1/2-1 cup of water and warm over stove top. Then mix the ingredients and repeat every 4 hours as needed.  Cough Syrup Recipe: Sweet Lemon & Honey Thyme  Ingredients a handful of fresh thyme sprigs   1 pint of water (2 cups)  1/2 cup honey (raw is best, but regular will do)  1/2 lemon chopped Instructions 1. Place the lemon in the pint jar and cover with the honey. The honey will macerate the lemons and draw out liquids which taste so delicious! 2. Meanwhile, toss the thyme leaves into a saucepan and cover them with the water. 3. Bring the water to a gentle simmer and reduce it to half, about a cup of tea. 4. When the tea is reduced and cooled a bit, strain the sprigs & leaves, add it into the pint jar and stir it well. 5. Give it a shake and use a spoonful as needed. 6. Store your homemade cough syrup in the refrigerator for about a month.  Is there anything I can do on my own to get rid of my cough? Yes. To help get rid of your cough, you can: ?Use a humidifier in your bedroom ?Use an over-the-counter cough medicine, or suck on cough drops or hard candy ?Stop smoking, if you smoke ?If you have allergies, avoid the things you are allergic to (like pollen, dust, animals, or mold) If you have acid reflux, your doctor or nurse will tell you which lifestyle  changes can help reduce symptoms.     IF you received an x-ray today, you will receive an invoice from [facility] Radiology. Please contact [facility] Radiology at [phone] with questions or concerns regarding your invoice.   IF you received labwork today, you will receive an invoice from [facility]. Please contact [facility] at [phone] with questions or concerns regarding your invoice.   Our billing staff will not be able to assist you with questions regarding bills from these companies.  You will be contacted with the lab results as soon as they are available. The fastest way to get your results is to activate your My Chart account. Instructions are located on the last page of this paperwork. If you have not heard from [place] regarding the results in 2 weeks, please contact this office.

## 2017-12-22 NOTE — Progress Notes (Signed)
Kyle Schwartz  MRN: [id number] DOB: July 05, 1926  PCP: Rogers Blocker, MD  Subjective:  Pt is a pleasant 84 year old male presents to clinic for cough x 3 weeks. He is here today with his wife.   Endorses cough and congestion x 3 weeks. Cough is productive. Cough wakes him up at night. He has been taking Mucinex and NyQuil.  Denies shob, wheezing, fever, chills, night sweats.   the patient  has a past medical history of Arthritis, Diabetes mellitus ([place]), Diverticulitis, blood clots, Hyperlipemia, Hyperlipidemia, Hypertension, and Prostate cancer ([place]).  Review of Systems  Constitutional: Negative for chills, diaphoresis, fatigue and fever.  HENT: Positive for congestion. Negative for postnasal drip, rhinorrhea, sinus pressure, sinus pain, sneezing and sore throat.   Respiratory: Positive for cough. Negative for shortness of breath and wheezing.     Patient Active Problem List   Diagnosis Date Noted  . Unspecified vitamin D deficiency 09/06/2012  . Allergic rhinitis 09/06/2012  . Special screening for malignant neoplasms, colon 04/19/2012  . Radiation proctitis 04/19/2012    Current Outpatient Medications on File Prior to Visit  Medication Sig Dispense Refill  . amLODipine (NORVASC) 10 MG tablet Take 10 mg by mouth daily.    Marland Kitchen aspirin 81 MG tablet Take 81 mg by mouth daily.    . benazepril (LOTENSIN) 20 MG tablet Take 20 mg by mouth daily.     . Cholecalciferol (VITAMIN D3) 2000 UNITS TABS Take by mouth 1 day or 1 dose.    Marland Kitchen CRESTOR 10 MG tablet Take 10 mg by mouth daily.     Marland Kitchen doxazosin (CARDURA) 4 MG tablet Take 4 mg by mouth at bedtime.     . fish oil-omega-3 fatty acids 1000 MG capsule Take 2 g by mouth daily.    . metFORMIN (GLUCOPHAGE) 500 MG tablet Take 500 mg by mouth daily with breakfast.     . Multiple Vitamins-Minerals (CENTRUM SILVER PO) Take by mouth 1 day or 1 dose.    . chlorhexidine (PERIDEX) 0.12 % solution Use as directed 15 mLs in the mouth or throat 2 (two)  times daily. Swish and spit twice a day (Patient not taking: Reported on 12/22/2017) 473 mL 0  . fluticasone (FLONASE) 50 MCG/ACT nasal spray Place 2 sprays into both nostrils daily. (Patient not taking: Reported on 12/22/2017) 16 g 6   No current facility-administered medications on file prior to visit.     No Known Allergies   Objective:  BP 130/60 (BP Location: Left Arm, Patient Position: Sitting, Cuff Size: Normal)   Pulse (!) 58   Temp 98 F (36.7 C) (Oral)   Resp 16   Ht 5' 7.5" (1.715 m)   Wt 184 lb 3.2 oz (83.6 kg)   SpO2 98%   BMI 28.42 kg/m   Physical Exam  Constitutional: He is oriented to person, place, and time. He appears well-developed and well-nourished.  Cardiovascular: Normal rate and regular rhythm.  Pulmonary/Chest: Effort normal. No respiratory distress.  Neurological: He is alert and oriented to person, place, and time.  Skin: Skin is warm and dry.  Psychiatric: He has a normal mood and affect. His behavior is normal. Judgment and thought content normal.  Vitals reviewed.   Assessment and Plan :  1. Acute bronchitis, unspecified organism 2. Cough - azithromycin (ZITHROMAX) 250 MG tablet; Take 2 tabs PO x 1 dose, then 1 tab PO QD x 4 days  Dispense: 6 tablet; Refill: 0 - promethazine-dextromethorphan (PROMETHAZINE-DM) 6.25-15  MG/5ML syrup; Take 5 mLs by mouth 4 (four) times daily as needed for cough.  Dispense: 118 mL; Refill: 0 - benzonatate (TESSALON) 100 MG capsule; Take 1-2 capsules (100-200 mg total) by mouth 3 (three) times daily as needed for cough.  Dispense: 40 capsule; Refill: 0 - pt presents with productive cough x 3 weeks. Plan to cover with azithromycin. Supportive measures discussed.  RTC in 5-7 days if no improvement.   Mercer Pod, PA-C  [facility] at [place] 12/22/2017 12:50 PM

## 2017-12-27 ENCOUNTER — Other Ambulatory Visit: Payer: Self-pay

## 2017-12-27 ENCOUNTER — Encounter: Payer: Self-pay | Admitting: Physician Assistant

## 2017-12-27 ENCOUNTER — Ambulatory Visit (INDEPENDENT_AMBULATORY_CARE_PROVIDER_SITE_OTHER): Payer: Medicare Other | Admitting: Physician Assistant

## 2017-12-27 ENCOUNTER — Ambulatory Visit (INDEPENDENT_AMBULATORY_CARE_PROVIDER_SITE_OTHER): Payer: Medicare Other

## 2017-12-27 VITALS — BP 128/58 | HR 76 | Temp 97.7°F | Resp 16 | Ht 68.0 in | Wt 185.6 lb

## 2017-12-27 DIAGNOSIS — J22 Unspecified acute lower respiratory infection: Secondary | ICD-10-CM | POA: Diagnosis not present

## 2017-12-27 DIAGNOSIS — R05 Cough: Secondary | ICD-10-CM

## 2017-12-27 DIAGNOSIS — R059 Cough, unspecified: Secondary | ICD-10-CM

## 2017-12-27 MED ORDER — HYDROCODONE-HOMATROPINE 5-1.5 MG/5ML PO SYRP: 5.0000 mL | ORAL_SOLUTION | Freq: Three times a day (TID) | ORAL | 0 refills | Status: AC | PRN

## 2017-12-27 MED ORDER — DOXYCYCLINE HYCLATE 100 MG PO TABS: 100.0000 mg | ORAL_TABLET | Freq: Two times a day (BID) | ORAL | 0 refills | Status: AC

## 2017-12-27 NOTE — Progress Notes (Signed)
Kyle Schwartz  MRN: [id number] DOB: Jun 17, 1926  PCP: Rogers Blocker, MD  Subjective:  Pt is a 84 year old male who presents to clinic for f/u cough and congestion. He was seen here about 4 days ago for the same and treated with azithromycin, promethazine and tessalon.  He has been coughing now for about 4 weeks.  Cough is productive. "after I bring up thick white phlegm, I feel a little better". However, the times he feels cleared up does not last that long. Cough is very bad at night.   Review of Systems  Constitutional: Negative for chills, diaphoresis, fatigue and fever.  HENT: Negative for congestion, postnasal drip, rhinorrhea, sinus pressure, sinus pain, sneezing and sore throat.   Respiratory: Positive for cough. Negative for shortness of breath and wheezing.     Patient Active Problem List   Diagnosis Date Noted  . Unspecified vitamin D deficiency 09/06/2012  . Allergic rhinitis 09/06/2012  . Special screening for malignant neoplasms, colon 04/19/2012  . Radiation proctitis 04/19/2012    Current Outpatient Medications on File Prior to Visit  Medication Sig Dispense Refill  . amLODipine (NORVASC) 10 MG tablet Take 10 mg by mouth daily.    Marland Kitchen aspirin 81 MG tablet Take 81 mg by mouth daily.    Marland Kitchen azithromycin (ZITHROMAX) 250 MG tablet Take 2 tabs PO x 1 dose, then 1 tab PO QD x 4 days 6 tablet 0  . benazepril (LOTENSIN) 20 MG tablet Take 20 mg by mouth daily.     . benzonatate (TESSALON) 100 MG capsule Take 1-2 capsules (100-200 mg total) by mouth 3 (three) times daily as needed for cough. 40 capsule 0  . Cholecalciferol (VITAMIN D3) 2000 UNITS TABS Take by mouth 1 day or 1 dose.    Marland Kitchen CRESTOR 10 MG tablet Take 10 mg by mouth daily.     Marland Kitchen doxazosin (CARDURA) 4 MG tablet Take 4 mg by mouth at bedtime.     . fish oil-omega-3 fatty acids 1000 MG capsule Take 2 g by mouth daily.    . fluticasone (FLONASE) 50 MCG/ACT nasal spray Place 2 sprays into both nostrils daily. 16 g 6  .  metFORMIN (GLUCOPHAGE) 500 MG tablet Take 500 mg by mouth daily with breakfast.     . Multiple Vitamins-Minerals (CENTRUM SILVER PO) Take by mouth 1 day or 1 dose.    . promethazine-dextromethorphan (PROMETHAZINE-DM) 6.25-15 MG/5ML syrup Take 5 mLs by mouth 4 (four) times daily as needed for cough. (Patient not taking: Reported on 12/27/2017) 118 mL 0   No current facility-administered medications on file prior to visit.     No Known Allergies   Objective:  BP (!) 128/58 (BP Location: Left Arm, Patient Position: Sitting, Cuff Size: Normal)   Pulse 76   Temp 97.7 F (36.5 C) (Oral)   Resp 16   Ht 5\' 8"  (1.727 m)   Wt 185 lb 9.6 oz (84.2 kg)   SpO2 97%   BMI 28.22 kg/m   Physical Exam  Constitutional: He is oriented to person, place, and time. He appears well-developed and well-nourished.  Cardiovascular: Normal rate and regular rhythm.  Pulmonary/Chest: Effort normal. No respiratory distress. He has rhonchi in the right lower field.  Neurological: He is alert and oriented to person, place, and time.  Skin: Skin is warm and dry.  Psychiatric: He has a normal mood and affect. His behavior is normal. Judgment and thought content normal.  Vitals reviewed.  Dg Chest  2 View  Result Date: 12/27/2017 CLINICAL DATA:  Initial evaluation for acute cough for 4 weeks. EXAM: CHEST - 2 VIEW COMPARISON:  Prior radiograph from 02/15/2017. FINDINGS: Cardiac and mediastinal silhouettes are stable in size and contour, and remain within normal limits. Mild aortic atherosclerosis. Lungs normally inflated. No focal infiltrates to suggest pneumonia. No pulmonary edema or pleural effusion. No pneumothorax. No acute osseous abnormality.  Thoracolumbar scoliosis noted. IMPRESSION: 1. No radiographic evidence for active cardiopulmonary disease. 2. Aortic atherosclerosis. Electronically Signed   By: Jeannine Boga M.D.   On: 12/27/2017 18:05    Assessment and Plan :  1. Lower respiratory infection -  doxycycline (VIBRA-TABS) 100 MG tablet; Take 1 tablet (100 mg total) by mouth 2 (two) times daily.  Dispense: 14 tablet; Refill: 0 - Pt presents for f/u cough. He has had a cough now for 4 weeks. Adventitious sounds heard on PE which were not present at his last OV. X-ray is negative. Vitals are stable. He does not appear ill. Plan to treat with Doxycycline. RTC if no improvement.  2. Cough - DG Chest 2 View; Future - HYDROcodone-homatropine (HYCODAN) 5-1.5 MG/5ML syrup; Take 5 mLs by mouth every 8 (eight) hours as needed for cough.  Dispense: 120 mL; Refill: 0    [name], PA-C  [facility] at [place] 12/27/2017 5:24 PM

## 2017-12-27 NOTE — Patient Instructions (Addendum)
  I am treating you today for pneumonia.  Start taking Doxycycline 100 mg twice daily for the next 7 days. Please use precaution in the sun.  Stay well hydrated to help your body rid the infection.   Hycodan is a cough syrup that will make you drowsy. Use this only as directed. Do not drive or take other sedating medications while taking this.   Thank you for coming in today. I hope you feel we met your needs.  Feel free to call PCP if you have any questions or further requests.  Please consider signing up for MyChart if you do not already have it, as this is a great way to communicate with me.  Best,  Whitney McVey, PA-C  IF you received an x-ray today, you will receive an invoice from [facility] Radiology. Please contact [facility] Radiology at [phone] with questions or concerns regarding your invoice.   IF you received labwork today, you will receive an invoice from [place]. Please contact [facility] at [phone] with questions or concerns regarding your invoice.   Our billing staff will not be able to assist you with questions regarding bills from these companies.  You will be contacted with the lab results as soon as they are available. The fastest way to get your results is to activate your My Chart account. Instructions are located on the last page of this paperwork. If you have not heard from [place] regarding the results in 2 weeks, please contact this office.

## 2018-01-12 ENCOUNTER — Ambulatory Visit (INDEPENDENT_AMBULATORY_CARE_PROVIDER_SITE_OTHER): Payer: Medicare Other | Admitting: Family Medicine

## 2018-01-12 ENCOUNTER — Encounter: Payer: Self-pay | Admitting: Family Medicine

## 2018-01-12 VITALS — BP 130/48 | HR 74 | Temp 97.9°F | Resp 17 | Ht 68.0 in | Wt 185.0 lb

## 2018-01-12 DIAGNOSIS — R058 Other specified cough: Secondary | ICD-10-CM

## 2018-01-12 DIAGNOSIS — R059 Cough, unspecified: Secondary | ICD-10-CM

## 2018-01-12 DIAGNOSIS — R05 Cough: Secondary | ICD-10-CM

## 2018-01-12 MED ORDER — PREDNISONE 20 MG PO TABS: ORAL_TABLET | ORAL | 0 refills | Status: AC

## 2018-01-12 MED ORDER — BENZONATATE 100 MG PO CAPS: 100.0000 mg | ORAL_CAPSULE | Freq: Three times a day (TID) | ORAL | 0 refills | Status: AC | PRN

## 2018-01-12 NOTE — Progress Notes (Signed)
Patient ID: Kyle Schwartz, male    DOB: January 18, 1926  Age: 84 y.o. MRN: [id number]  Chief Complaint  Patient presents with  . Follow-up    cough     Subjective:   Pleasant and alert 84 year old gentleman who is here for his cough.  He was treated for possible pneumonia, but the cough has just continued to persist.  He does not feel sick.  Not running fever.  It just is this constant cough.  He is not wheezing.  He does not smoke and never has.  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  He has no history of chronic lung disease.  He has not been swelling in his ankles at all.  Current allergies, medications, problem list, past/family and social histories reviewed.  Objective:  BP (!) 130/48   Pulse 74   Temp 97.9 F (36.6 C) (Oral)   Resp 17   Ht 5\' 8"  (1.727 m)   Wt 185 lb (83.9 kg)   SpO2 98%   BMI 28.13 kg/m   Healthy-appearing gentleman.  No acute distress.  TMs have some dry wax.  Throat was clear.  Neck supple without significant nodes.  Chest is clear to auscultation.  Heart regular without murmur.  Abdomen soft and nontender.  No ankle edema.  Assessment & Plan:   Assessment: 1. Post-viral cough syndrome   2. Cough       Plan: We will treat for a post viral cough.  See instructions.  No orders of the defined types were placed in this encounter.   Meds ordered this encounter  Medications  . predniSONE (DELTASONE) 20 MG tablet    Sig: Take 3 pills daily for 2 days, then 2 daily for 2 days, then 1 daily for 2 days for post viral cough.  Take after breakfast.    Dispense:  12 tablet    Refill:  0  . benzonatate (TESSALON) 100 MG capsule    Sig: Take 1-2 capsules (100-200 mg total) by mouth 3 (three) times daily as needed for cough.    Dispense:  40 capsule    Refill:  0         Patient Instructions   Drink plenty of fluids  Take the benzonatate cough pills 1 pill 3 times daily as needed for cough and  irritation  Take the prednisone 20 mg 3 pills daily for 2 days, then 2 daily for 2 days, then 1 daily for 2 days to try and treat the inflammation in your airways.  I think that after 3 or 4 days you will notice it is doing much better.  These can cause restless sleep if you take them at bedtime, so try to take them early in the day after eating.  Also they can cause some confusion or lightheadedness in elderly individuals, so discontinue if any concerns.  Get recheck as needed   IF you received an x-ray today, you will receive an invoice from [facility]. Please contact [facility] at [phone] with questions or concerns regarding your invoice.   IF you received labwork today, you will receive an invoice from [place]. Please contact [facility] at [phone] with questions or concerns regarding your invoice.   Our billing staff will not be able to assist you with questions regarding bills from these companies.  You will be contacted with the lab results as soon as they are available. The fastest way to get your results  is to activate your My Chart account. Instructions are located on the last page of this paperwork. If you have not heard from [place] regarding the results in 2 weeks, please contact this office.         Return if symptoms worsen or fail to improve.   Ruben Reason, MD 01/12/2018

## 2018-01-12 NOTE — Patient Instructions (Addendum)
Drink plenty of fluids  Take the benzonatate cough pills 1 pill 3 times daily as needed for cough and irritation  Take the prednisone 20 mg 3 pills daily for 2 days, then 2 daily for 2 days, then 1 daily for 2 days to try and treat the inflammation in your airways.  I think that after 3 or 4 days you will notice it is doing much better.  These can cause restless sleep if you take them at bedtime, so try to take them early in the day after eating.  Also they can cause some confusion or lightheadedness in elderly individuals, so discontinue if any concerns.  Get recheck as needed   IF you received an x-ray today, you will receive an invoice from [facility]. Please contact [facility] at [phone] with questions or concerns regarding your invoice.   IF you received labwork today, you will receive an invoice from [place]. Please contact [facility] at [phone] with questions or concerns regarding your invoice.   Our billing staff will not be able to assist you with questions regarding bills from these companies.  You will be contacted with the lab results as soon as they are available. The fastest way to get your results is to activate your My Chart account. Instructions are located on the last page of this paperwork. If you have not heard from [place] regarding the results in 2 weeks, please contact this office.

## 2018-08-01 ENCOUNTER — Other Ambulatory Visit (HOSPITAL_COMMUNITY): Payer: Self-pay | Admitting: Internal Medicine

## 2018-08-01 ENCOUNTER — Ambulatory Visit (HOSPITAL_COMMUNITY)
Admission: RE | Admit: 2018-08-01 | Discharge: 2018-08-01 | Disposition: A | Discharge status: Home / Self Care | Payer: Medicare Other | Source: Ambulatory Visit | Attending: Vascular Surgery | Admitting: Vascular Surgery

## 2018-08-01 DIAGNOSIS — R609 Edema, unspecified: Secondary | ICD-10-CM | POA: Diagnosis not present

## 2018-08-01 NOTE — Progress Notes (Signed)
Bilateral venous duplex completed, preliminary result called to Dr. Marlou Sa, Preliminary report in [place].

## 2019-09-03 ENCOUNTER — Other Ambulatory Visit: Payer: Self-pay

## 2019-09-03 ENCOUNTER — Other Ambulatory Visit: Payer: Self-pay | Admitting: Internal Medicine

## 2019-09-03 ENCOUNTER — Ambulatory Visit
Admission: RE | Admit: 2019-09-03 | Discharge: 2019-09-03 | Disposition: A | Discharge status: Home / Self Care | Payer: Medicare Other | Source: Ambulatory Visit | Attending: Internal Medicine | Admitting: Internal Medicine

## 2019-09-03 DIAGNOSIS — M545 Low back pain, unspecified: Secondary | ICD-10-CM

## 2019-09-03 DIAGNOSIS — M25562 Pain in left knee: Secondary | ICD-10-CM

## 2020-05-18 DEATH — deceased
